# Patient Record
Sex: Female | Born: 1970 | Race: White | Hispanic: No | Marital: Married
Health system: Southern US, Community
[De-identification: ages and names within clinical notes are randomized; demographics above are authoritative.]

---

## 1998-03-16 ENCOUNTER — Other Ambulatory Visit: Admission: RE | Admit: 1998-03-16 | Discharge: 1998-03-16 | Payer: Self-pay | Admitting: Obstetrics and Gynecology

## 1998-05-10 ENCOUNTER — Observation Stay (HOSPITAL_COMMUNITY): Admission: AD | Admit: 1998-05-10 | Discharge: 1998-05-10 | Payer: Self-pay | Admitting: *Deleted

## 1998-05-22 ENCOUNTER — Inpatient Hospital Stay (HOSPITAL_COMMUNITY): Admission: AD | Admit: 1998-05-22 | Discharge: 1998-05-22 | Payer: Self-pay | Admitting: Obstetrics and Gynecology

## 1998-10-21 ENCOUNTER — Inpatient Hospital Stay (HOSPITAL_COMMUNITY): Admission: AD | Admit: 1998-10-21 | Discharge: 1998-10-21 | Payer: Self-pay | Admitting: Obstetrics and Gynecology

## 1998-10-22 ENCOUNTER — Inpatient Hospital Stay (HOSPITAL_COMMUNITY): Admission: AD | Admit: 1998-10-22 | Discharge: 1998-10-22 | Payer: Self-pay | Admitting: Obstetrics and Gynecology

## 1998-12-20 ENCOUNTER — Inpatient Hospital Stay (HOSPITAL_COMMUNITY): Admission: AD | Admit: 1998-12-20 | Discharge: 1998-12-21 | Payer: Self-pay | Admitting: Obstetrics and Gynecology

## 1999-01-30 ENCOUNTER — Other Ambulatory Visit: Admission: RE | Admit: 1999-01-30 | Discharge: 1999-01-30 | Payer: Self-pay | Admitting: Obstetrics and Gynecology

## 2000-02-28 ENCOUNTER — Other Ambulatory Visit: Admission: RE | Admit: 2000-02-28 | Discharge: 2000-02-28 | Payer: Self-pay | Admitting: Obstetrics and Gynecology

## 2001-02-28 ENCOUNTER — Other Ambulatory Visit: Admission: RE | Admit: 2001-02-28 | Discharge: 2001-02-28 | Payer: Self-pay | Admitting: Obstetrics and Gynecology

## 2002-03-02 ENCOUNTER — Other Ambulatory Visit: Admission: RE | Admit: 2002-03-02 | Discharge: 2002-03-02 | Payer: Self-pay | Admitting: Obstetrics and Gynecology

## 2003-03-18 ENCOUNTER — Encounter: Admission: RE | Admit: 2003-03-18 | Discharge: 2003-03-18 | Payer: Self-pay | Admitting: Obstetrics and Gynecology

## 2003-03-24 ENCOUNTER — Other Ambulatory Visit: Admission: RE | Admit: 2003-03-24 | Discharge: 2003-03-24 | Payer: Self-pay | Admitting: Obstetrics and Gynecology

## 2004-07-30 ENCOUNTER — Inpatient Hospital Stay (HOSPITAL_COMMUNITY): Admission: AD | Admit: 2004-07-30 | Discharge: 2004-07-30 | Payer: Self-pay | Admitting: Obstetrics & Gynecology

## 2004-08-27 ENCOUNTER — Inpatient Hospital Stay (HOSPITAL_COMMUNITY): Admission: AD | Admit: 2004-08-27 | Discharge: 2004-08-27 | Payer: Self-pay | Admitting: Obstetrics and Gynecology

## 2004-11-10 ENCOUNTER — Inpatient Hospital Stay (HOSPITAL_COMMUNITY): Admission: AD | Admit: 2004-11-10 | Discharge: 2004-11-10 | Payer: Self-pay | Admitting: Obstetrics and Gynecology

## 2004-11-11 ENCOUNTER — Inpatient Hospital Stay (HOSPITAL_COMMUNITY): Admission: AD | Admit: 2004-11-11 | Discharge: 2004-11-11 | Payer: Self-pay | Admitting: *Deleted

## 2004-12-11 ENCOUNTER — Inpatient Hospital Stay (HOSPITAL_COMMUNITY): Admission: AD | Admit: 2004-12-11 | Discharge: 2004-12-13 | Payer: Self-pay | Admitting: *Deleted

## 2006-11-18 ENCOUNTER — Ambulatory Visit: Payer: Self-pay | Admitting: Internal Medicine

## 2006-12-04 ENCOUNTER — Ambulatory Visit: Payer: Self-pay | Admitting: Internal Medicine

## 2008-01-19 ENCOUNTER — Ambulatory Visit: Payer: Self-pay | Admitting: Internal Medicine

## 2009-08-03 ENCOUNTER — Ambulatory Visit: Payer: Self-pay | Admitting: Sports Medicine

## 2009-08-03 DIAGNOSIS — M2170 Unequal limb length (acquired), unspecified site: Secondary | ICD-10-CM | POA: Insufficient documentation

## 2009-08-03 DIAGNOSIS — R269 Unspecified abnormalities of gait and mobility: Secondary | ICD-10-CM | POA: Insufficient documentation

## 2009-08-03 DIAGNOSIS — M629 Disorder of muscle, unspecified: Secondary | ICD-10-CM | POA: Insufficient documentation

## 2009-10-07 ENCOUNTER — Encounter: Payer: Self-pay | Admitting: Sports Medicine

## 2010-03-22 ENCOUNTER — Ambulatory Visit: Payer: Self-pay | Admitting: Internal Medicine

## 2010-04-27 ENCOUNTER — Ambulatory Visit: Payer: Self-pay | Admitting: Sports Medicine

## 2010-06-01 ENCOUNTER — Ambulatory Visit
Admission: RE | Admit: 2010-06-01 | Discharge: 2010-06-01 | Payer: Self-pay | Source: Home / Self Care | Attending: Sports Medicine | Admitting: Sports Medicine

## 2010-06-06 NOTE — Assessment & Plan Note (Signed)
Summary: NP,KNEE PAIN,RUNNER,MC   Vital Signs:  Patient profile:   40 year old female Height:      67 inches Weight:      120 pounds BMI:     18.86 BP sitting:   122 / 82  Vitals Entered By: Lillia Pauls CMA (August 03, 2009 11:36 AM)  History of Present Illness: This is a pleasant young lady - runner and triathlete. She has had frustrating probs w ITB issues now for about 3 mos has had good eval by Dr Zachery Dauer sent to PT for strength work and work with SIJ and pseudo leg length probs Mobic has helped as has volt gel  however, even w 8 sessions of PT still gets sharp pain wonders if she needs orthotics or other intervention and comes for my opinion  Xrays from [facility] are wnl  PT notes reviewed and are reasonable rehab program with emphasis on core strength and also regaining nl pelvic alignment  Allergies (verified): 1)  ! Augmentin 2)  ! Pcn 3)  ! Ceftin  Physical Exam  General:  Well-developed,well-nourished,in no acute distress; alert,appropriate and cooperative throughout examination Msk:  Bilat knee exam shows no effusion; stable ligaments; negative Mcmurray's and provocative meniscal tests; non painful patellar compression; patellar and quadriceps tendons unremarkable  good quad and VMO strength bilat alignmnet is good  leg length - left is 1 cm shorter on mult meaures and after adjusting pelvis  FABER OK SI joint mobility - tight and worse on RT today   Hip abduction mod weak on left and mildly weak on RT hip rotation mildly weak on left and norm on RT  feet are neutral  Gait show that she has a rapid horizontal shifting o left pateall and less on RT In fact the entire ITB area shows "wobble" with running on RT turns RT foot out by 5 to 10 deg and left by 3 deg    Impression & Recommendations:  Problem # 1:  ABNORMALITY OF GAIT (ICD-781.2)  This is significant with rotation of foot prob worsening ITB sxs  Sports insoles with lat 1 cm lift  tapered to 1/2 cm med lift on left actually corrects most of foot turnout on left  will try this to control abnorm motion and it does improve it if not successful consider custom orthotics  Orders: Sports Insoles (Z6109)  Problem # 2:  ITBS, 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

## 2010-06-06 NOTE — Consult Note (Signed)
Summary: [facility]   Imported By: Marily Memos 08/03/2009 12:29:17  _____________________________________________________________________  External Attachment:    Type:   Image     Comment:   External Document

## 2010-06-08 NOTE — Consult Note (Signed)
Summary: [facility]  [facility]   Imported By: Marily Memos 04/27/2010 15:19:11  _____________________________________________________________________  External Attachment:    Type:   Image     Comment:   External Document

## 2010-06-08 NOTE — Assessment & Plan Note (Signed)
Summary: F/U IT [facility]   Vital Signs:  Patient profile:   40 year old female Pulse rate:   71 / minute BP sitting:   102 / 68  (right arm)  Vitals Entered By: Rochele Pages RN (June 01, 2010 10:26 AM) CC: f/u bilat ITB pain   CC:  f/u bilat ITB pain.  History of Present Illness: 40yo female to office for f/u ITB syndrome. Last visit 58-month ago & fitted with knee sleeves & 1st ray post on insoles. Started having calf pain with insole changes and stopped using these. Knee sleeves have been helpful up until last week.  Steadily increased mileage - longest run of 9-miles, running about 15-20 miles/wk.   Started to have return of ITB pain in both legs about 4.5 miles into run 5 days ago & need to stop.  No improvement with ITB stretches at the time. Has been doing HEP intermittantly.   Using volaten gel as needed, but not overly helpful. 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 Has been taking a recovery drink after training & long runs.    Preventive Screening-Counseling & Management  Alcohol-Tobacco     Smoking Status: never  Allergies: 1)  ! Augmentin 2)  ! Pcn 3)  ! Ceftin  Social History: Smoking Status:  never  Review of Systems       per HPI, otherwise 10-pt ROS negative  Physical Exam  General:  Well-developed,well-nourished,in no acute distress; alert,appropriate and cooperative throughout examination Msk:  HIPS: FROM b/l without pain.  no TTP over greater troch.  TTP along distal ITB b/l.  good SI joint mobility b/l.  Good strength with abduction, adduction, flexion, extension b/l.    GAIT: Lt leg 1cm shorter than right.  slight horizontal movement of knees b/l - although improved from last visit.  No trendelenburg.  No excess pronation or supination.  No limp.  Neurovascularly intact distally    Impression & Recommendations:  Problem # 1:  ILIOTIBIAL BAND SYNDROME,  BILATERAL (ICD-728.89)  - Suspect aggrevation of symptoms related to increased stress & poor recovery recently. - given new set of body helix knee sleeves to wear with running - Reviewed proper nutrition & hydration - recommended use of foam roller over ITB - handout given - Cont ITB stretches - f/u 4 weeks if symptoms persist - Plan to check body fat when training regularly  Orders: Garment,belt,sleeve or other covering ,elastic or similar stretch (Z6109)  Problem # 2:  UNEQUAL LEG LENGTH (ICD-736.81) - Lt leg 1cm shorter than left - no significant trendelenburg.  May be contributor to ITBS  Patient Instructions: 1)  We think your current IT band symptoms likely related to decreased rest/recovery over the last week.   2)  Start using foam roller on IT band to help loosen. 3)  Cont. to wear knee sleeves as you are doing. 4)  For hydration drink may try starting with regular gatorade, add salt (as stated below) if still having cramps & heavy salt loss.  Add 1/2 tsp salt to 16-20 oz of gatorade to help with salt loss.   5)  May consider adding more carbohydrate or small amount of caffeine if exercising over 2-hours. 6)  Ensure having good recovery drink within 1-hr after race - cont. recovery drink you are using. 7)  May consider checking body fat level in future before starting serious racing. 8)  Follow-up as needed for  IT band syndrome.   9)  Follow-up for body fat measurement when well into your training program.   Orders Added: 1)  Est. Patient Level III [95284] 2)  Garment,belt,sleeve or other covering ,elastic or similar stretch [X3244]

## 2010-06-08 NOTE — Assessment & Plan Note (Signed)
Summary: IT BAND PAIN,MC   Vital Signs:  Patient profile:   40 year old female Height:      67 inches Weight:      118 pounds BP sitting:   104 / 74  Vitals Entered By: Lillia Pauls CMA (April 27, 2010 2:43 PM)  History of Present Illness: Has done multiple competitive triathalons this past fall has also done half marathons this fall.  Now getting burning at both lateral ITB.  Has not noticed significant swelling Very painful after 5-6 miles. Has been to PT for this problem.    I had seen her in march did not tolerate green sports insoles with lift for shorter left leg  comes for repeat evalution as she cannot increase training 2/2 her lat knee pain    Allergies: 1)  ! Augmentin 2)  ! Pcn 3)  ! Ceftin  Physical Exam  General:  Well-developed,well-nourished,in no acute distress; alert,appropriate and cooperative throughout examination Msk:  Left leg 1 cm shorter than rt. SI joint motion good bilat Strong abductors bilat Hip rotation strength excellent bilat Adductors strong bilat   Extremities:  Gait seems to be key issue with bilat rapid horizontal knee and quad motion there is no trendelenburg she does not show excess pronation or supination and has neutral strike but forefoot varus   Impression & Recommendations:  Problem # 1:  ILIOTIBIAL BAND SYNDROME, BILATERAL (ICD-728.89) goos strength good stretching  seems this must be biomechanical  try body helix  open patella  Problem # 2:  ABNORMALITY OF GAIT (ICD-781.2)  Orders: Garment,belt,sleeve or other covering ,elastic or similar stretch (Z3664)  note gait imporved w first ray post this takes out some of horizontal wobble trail for 1 mo may need cusstom orthotic  Problem # 3:  UNEQUAL LEG LENGTH (ICD-736.81)  Orders: Garment,belt,sleeve or other covering ,elastic or similar stretch (Q0347)  correction removed as this did not seem to be helping and she does not trendelenburg  reck 1  mo   Orders Added: 1)  Garment,belt,sleeve or other covering ,elastic or similar stretch [A4466] 2)  Est. Patient Level III [42595]

## 2010-09-22 NOTE — H&P (Signed)
NAMEMEDINA, DEGRAFFENREID           ACCOUNT NO.:  [id number]   MEDICAL RECORD NO.:  [id number]          PATIENT TYPE:  INP   LOCATION:  9165                          FACILITY:  [facility]   PHYSICIAN:  Lenoard Aden, M.D.DATE OF BIRTH:  Feb 22, 1971   DATE OF ADMISSION:  12/11/2004  DATE OF DISCHARGE:                                HISTORY & PHYSICAL   CHIEF COMPLAINT:  Labor.   HISTORY OF PRESENT ILLNESS:  The patient is a 41 year old white female G3,  P2, EDD of December 28, 2004, at 37+ weeks with increased frequency of  contractions, history of advanced cervical dilatation.   PAST MEDICAL HISTORY:  Remarkable for spontaneous vaginal delivery x2.   ALLERGIES:  PENICILLIN, CEFTIN AND AUGMENTIN.   OBSTETRIC HISTORY:  She has a history of spontaneous vaginal delivery at 38  weeks on two occasions. Pregnancy course otherwise uncomplicated.   PAST MEDICAL HISTORY:  1.  History of sinus surgery.  2.  History of diabetes.  3.  Hypertension.   PRENATAL LAB DATA:  Blood type A positive, rubella immune, hepatitis  negative, GBS negative.   PHYSICAL EXAMINATION:  GENERAL:  She is a well-developed, well-nourished  white female in no acute distress.  HEENT:  Normal.  LUNGS:  Clear.  HEART:  Regular rhythm.  ABDOMEN:  Soft, gravid, nontender.  NEUROLOGIC EXAM:  Nonfocal.  PELVIC EXAM:  Cervix is 5 cm, vertex at -1.  EXTREMITIES:  Normal.   IMPRESSION:  Term intrauterine pregnancy in active labor.  History of GBS positivity, negative GBS with pregnancy.   PLAN:  Anticipate attempts at vaginal delivery. Risks, benefits discussed.  The patient acknowledges and will proceed.       RJT/MEDQ  D:  12/12/2004  T:  12/12/2004  Job:  [id number]

## 2011-06-19 ENCOUNTER — Ambulatory Visit: Payer: Self-pay | Admitting: Internal Medicine

## 2011-10-24 ENCOUNTER — Ambulatory Visit (INDEPENDENT_AMBULATORY_CARE_PROVIDER_SITE_OTHER): Payer: BC Managed Care – PPO | Admitting: Sports Medicine

## 2011-10-24 VITALS — BP 100/70

## 2011-10-24 DIAGNOSIS — M79671 Pain in right foot: Secondary | ICD-10-CM | POA: Insufficient documentation

## 2011-10-24 DIAGNOSIS — M79609 Pain in unspecified limb: Secondary | ICD-10-CM

## 2011-10-24 NOTE — Patient Instructions (Addendum)
Use ankle brace as discussed  Do 3 sets of 15 of the below exercises to help with ankle stability: Stair step exercise  Cone touches One leg balances  Return as needed

## 2011-10-24 NOTE — Assessment & Plan Note (Addendum)
Pain most likely due to hypermobility of ankle joint and cuboid subluxation.  Body helix- X- sleeve given to patient for stablility of joint during activity.  Pt to return as needed.  Pt also given exercises to improve ankle stablility- see patient instructions.  Note that her running gait seemed pretty neutral with slt foot turnout bilat but no excess motion

## 2011-10-24 NOTE — Progress Notes (Signed)
  Subjective:    Patient ID: Rachel Gomez, female    DOB: 03-Jan-1971, 41 y.o.   MRN: [id number]  HPI Right foot pain: Patient reports pain in right Achilles area this started in late May after running a half iron man. This improved with rest over a couple of weeks. And currently has resolved. But is getting ready to train again for another race and wanted to have this evaluated before starting training regimen again. Also having right dorsal foot pain that started this weekend. Describes it as a sharp pain. This started in the morning upon awakening over the weekend. Worse supportive shoes and this seemed to help. Pain in back of right foot now greatly improved. Has not had any pain today. No redness. No swelling.    Review of Systems As per above.    Objective:   Physical Exam  Constitutional: She appears well-developed.  Cardiovascular: Normal rate.   Pulmonary/Chest: Effort normal.  Musculoskeletal: She exhibits no edema.       Right ankle/ foot: Normal appearance. No redness. No swelling.  Normal rom.-no pain. Ligaments wnl. No pain on palpation of ankle or foot.  Normal strength.  + hypermobility of ankle joint/ligaments.    AT is not thickened or TTP On cuboid motion test she gets excess motion at RT cuboid articulation with base 5th MT        Assessment & Plan:

## 2012-02-14 ENCOUNTER — Ambulatory Visit (INDEPENDENT_AMBULATORY_CARE_PROVIDER_SITE_OTHER): Payer: BC Managed Care – PPO | Admitting: Sports Medicine

## 2012-02-14 VITALS — BP 98/62 | Ht 67.0 in | Wt 120.0 lb

## 2012-02-14 DIAGNOSIS — M25579 Pain in unspecified ankle and joints of unspecified foot: Secondary | ICD-10-CM

## 2012-02-14 DIAGNOSIS — M7630 Iliotibial band syndrome, unspecified leg: Secondary | ICD-10-CM

## 2012-02-14 DIAGNOSIS — M629 Disorder of muscle, unspecified: Secondary | ICD-10-CM

## 2012-02-14 DIAGNOSIS — M25569 Pain in unspecified knee: Secondary | ICD-10-CM

## 2012-02-14 MED ORDER — DICLOFENAC SODIUM 1 % TD GEL: TRANSDERMAL | Status: AC

## 2012-02-14 NOTE — Progress Notes (Signed)
  Subjective:    Patient ID: Rachel Gomez, female    DOB: May 20, 1970, 41 y.o.   MRN: [id number]  HPI chief complaint left knee and left ankle pain  Very pleasant 41 year old runner comes in today complaining of both left ankle and left knee pain. In regards to the left ankle she's having some feelings of instability and some pain at the insertion of the Achilles tendon onto the calcaneus. She's had similar problems in her right ankle which is responded well to a body helix X. brace she would like to try one for her left. In regards to the left knee, she had a sudden onset of lateral left knee pain at about mile 14 of a 20 mile run this past weekend. She was able to complete her run but has had persistent discomfort since. She has a history of IT band in each of her knees, but feels like her current pain is different in nature to what she has experienced previously. She describes a dull aching pain that is worse with weightbearing. She has not noticed any swelling. She denies any locking catching or feelings of instability. She was given a body helix knee sleeve on her previous visit which she wears on occasion. She's currently training for the [facility] marathon which is to take place in 3 weeks. She denies any pain in her hip. She saw her chiropractor earlier this week for an adjustment and he mentioned to her that she may have a early shin splints but she denies any pain in the lower leg.     Review of Systems     Objective:   Physical Exam Well-developed fit appearing 41 year old female. No acute distress. Awake alert and oriented x3. Vital signs are reviewed  Left knee: Full range of motion without an effusion. No soft tissue swelling. She is tender to palpation over the lateral femoral condyle at the distal IT band. No joint line tenderness. Negative McMurray. Negative Thessalys. Good ligamentous stability. Good quad strength, but she does have weakness with resisted hip abduction. Left  ankle: Full range of motion no effusion. Slight tenderness at the calcaneal insertion of the Achilles tendon but no thickening of the tendon is appreciated. No nodularities. No thickening. Neurovascularly intact distally. Walks without a limp.       Assessment & Plan:  1. Left knee pain secondary to distal IT band syndrome 2. 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 strap for the left ankle. Although her pain is a little different in nature than what she experienced previously with her IT band problems, her exam and history do fit with this diagnosis. I've given her a refill on her Voltaren gel which has been helpful to her in the past. I recommended that she resume using her body helix knee sleeve. Home exercise program consisting of IT band stretches and hip abductor strengthening. She will ice her knee at the end of activity. She can continue to train for her marathon as her symptoms allow, but they may not completely resolve until after the race. She will let me know if symptoms worsen or she develops swelling in the knee. Followup prn.

## 2012-03-11 ENCOUNTER — Ambulatory Visit: Payer: BC Managed Care – PPO | Admitting: Sports Medicine

## 2012-06-20 ENCOUNTER — Ambulatory Visit: Payer: Self-pay | Admitting: Internal Medicine

## 2013-08-10 ENCOUNTER — Ambulatory Visit: Payer: Self-pay | Admitting: Obstetrics and Gynecology

## 2013-08-17 ENCOUNTER — Ambulatory Visit: Payer: Self-pay | Admitting: Internal Medicine

## 2014-08-12 ENCOUNTER — Ambulatory Visit
Admit: 2014-08-12 | Disposition: A | Discharge status: Home / Self Care | Payer: Self-pay | Attending: Internal Medicine | Admitting: Internal Medicine

## 2014-08-18 ENCOUNTER — Ambulatory Visit
Admit: 2014-08-18 | Disposition: A | Discharge status: Home / Self Care | Payer: Self-pay | Attending: Obstetrics and Gynecology | Admitting: Obstetrics and Gynecology

## 2015-01-03 ENCOUNTER — Other Ambulatory Visit: Payer: Self-pay | Admitting: General Surgery

## 2015-01-03 DIAGNOSIS — R928 Other abnormal and inconclusive findings on diagnostic imaging of breast: Secondary | ICD-10-CM

## 2015-02-08 ENCOUNTER — Ambulatory Visit
Admission: RE | Admit: 2015-02-08 | Discharge: 2015-02-08 | Disposition: A | Discharge status: Home / Self Care | Payer: BLUE CROSS/BLUE SHIELD | Source: Ambulatory Visit | Attending: General Surgery | Admitting: General Surgery

## 2015-02-08 DIAGNOSIS — R928 Other abnormal and inconclusive findings on diagnostic imaging of breast: Secondary | ICD-10-CM

## 2015-03-12 ENCOUNTER — Other Ambulatory Visit: Payer: Self-pay | Admitting: General Surgery

## 2015-03-12 DIAGNOSIS — N6001 Solitary cyst of right breast: Secondary | ICD-10-CM

## 2015-08-15 ENCOUNTER — Other Ambulatory Visit: Payer: BLUE CROSS/BLUE SHIELD

## 2015-08-17 ENCOUNTER — Ambulatory Visit
Admission: RE | Admit: 2015-08-17 | Discharge: 2015-08-17 | Disposition: A | Discharge status: Home / Self Care | Payer: BLUE CROSS/BLUE SHIELD | Source: Ambulatory Visit | Attending: General Surgery | Admitting: General Surgery

## 2015-08-17 DIAGNOSIS — N6001 Solitary cyst of right breast: Secondary | ICD-10-CM

## 2016-07-09 ENCOUNTER — Other Ambulatory Visit: Payer: Self-pay | Admitting: Internal Medicine

## 2016-07-09 DIAGNOSIS — N6001 Solitary cyst of right breast: Secondary | ICD-10-CM

## 2016-07-18 DIAGNOSIS — L600 Ingrowing nail: Secondary | ICD-10-CM | POA: Diagnosis not present

## 2016-07-18 DIAGNOSIS — M79674 Pain in right toe(s): Secondary | ICD-10-CM | POA: Diagnosis not present

## 2016-07-18 DIAGNOSIS — L602 Onychogryphosis: Secondary | ICD-10-CM | POA: Diagnosis not present

## 2016-07-18 DIAGNOSIS — M79675 Pain in left toe(s): Secondary | ICD-10-CM | POA: Diagnosis not present

## 2016-08-01 DIAGNOSIS — L600 Ingrowing nail: Secondary | ICD-10-CM | POA: Diagnosis not present

## 2016-08-01 DIAGNOSIS — M79675 Pain in left toe(s): Secondary | ICD-10-CM | POA: Diagnosis not present

## 2016-08-01 DIAGNOSIS — L602 Onychogryphosis: Secondary | ICD-10-CM | POA: Diagnosis not present

## 2016-08-01 DIAGNOSIS — M79674 Pain in right toe(s): Secondary | ICD-10-CM | POA: Diagnosis not present

## 2016-08-16 ENCOUNTER — Other Ambulatory Visit: Payer: Self-pay | Admitting: Obstetrics and Gynecology

## 2016-08-16 DIAGNOSIS — N6001 Solitary cyst of right breast: Secondary | ICD-10-CM

## 2016-08-17 ENCOUNTER — Inpatient Hospital Stay: Admission: RE | Admit: 2016-08-17 | Payer: BLUE CROSS/BLUE SHIELD | Source: Ambulatory Visit

## 2016-08-17 ENCOUNTER — Other Ambulatory Visit: Payer: BLUE CROSS/BLUE SHIELD

## 2016-08-23 ENCOUNTER — Ambulatory Visit
Admission: RE | Admit: 2016-08-23 | Discharge: 2016-08-23 | Disposition: A | Discharge status: Home / Self Care | Payer: BLUE CROSS/BLUE SHIELD | Source: Ambulatory Visit | Attending: Obstetrics and Gynecology | Admitting: Obstetrics and Gynecology

## 2016-08-23 DIAGNOSIS — N6001 Solitary cyst of right breast: Secondary | ICD-10-CM

## 2016-11-09 DIAGNOSIS — J329 Chronic sinusitis, unspecified: Secondary | ICD-10-CM | POA: Diagnosis not present

## 2016-11-12 DIAGNOSIS — L600 Ingrowing nail: Secondary | ICD-10-CM | POA: Diagnosis not present

## 2016-11-12 DIAGNOSIS — L602 Onychogryphosis: Secondary | ICD-10-CM | POA: Diagnosis not present

## 2016-12-12 DIAGNOSIS — Z0000 Encounter for general adult medical examination without abnormal findings: Secondary | ICD-10-CM | POA: Diagnosis not present

## 2016-12-17 DIAGNOSIS — H524 Presbyopia: Secondary | ICD-10-CM | POA: Diagnosis not present

## 2016-12-17 DIAGNOSIS — Z0000 Encounter for general adult medical examination without abnormal findings: Secondary | ICD-10-CM | POA: Diagnosis not present

## 2017-03-04 DIAGNOSIS — Z23 Encounter for immunization: Secondary | ICD-10-CM | POA: Diagnosis not present

## 2017-03-18 DIAGNOSIS — L602 Onychogryphosis: Secondary | ICD-10-CM | POA: Diagnosis not present

## 2017-03-18 DIAGNOSIS — L600 Ingrowing nail: Secondary | ICD-10-CM | POA: Diagnosis not present

## 2017-04-10 DIAGNOSIS — R102 Pelvic and perineal pain: Secondary | ICD-10-CM | POA: Diagnosis not present

## 2017-04-20 DIAGNOSIS — J019 Acute sinusitis, unspecified: Secondary | ICD-10-CM | POA: Diagnosis not present

## 2017-05-06 DIAGNOSIS — Z01419 Encounter for gynecological examination (general) (routine) without abnormal findings: Secondary | ICD-10-CM | POA: Diagnosis not present

## 2017-05-06 DIAGNOSIS — Z681 Body mass index (BMI) 19 or less, adult: Secondary | ICD-10-CM | POA: Diagnosis not present

## 2017-05-14 DIAGNOSIS — J029 Acute pharyngitis, unspecified: Secondary | ICD-10-CM | POA: Diagnosis not present

## 2017-05-14 DIAGNOSIS — R6889 Other general symptoms and signs: Secondary | ICD-10-CM | POA: Diagnosis not present

## 2017-05-24 DIAGNOSIS — J019 Acute sinusitis, unspecified: Secondary | ICD-10-CM | POA: Diagnosis not present

## 2017-06-17 DIAGNOSIS — I8392 Asymptomatic varicose veins of left lower extremity: Secondary | ICD-10-CM | POA: Diagnosis not present

## 2017-06-18 DIAGNOSIS — J019 Acute sinusitis, unspecified: Secondary | ICD-10-CM | POA: Diagnosis not present

## 2017-06-18 DIAGNOSIS — I872 Venous insufficiency (chronic) (peripheral): Secondary | ICD-10-CM | POA: Diagnosis not present

## 2017-06-18 DIAGNOSIS — I8392 Asymptomatic varicose veins of left lower extremity: Secondary | ICD-10-CM | POA: Diagnosis not present

## 2017-07-15 DIAGNOSIS — I781 Nevus, non-neoplastic: Secondary | ICD-10-CM | POA: Diagnosis not present

## 2017-07-24 ENCOUNTER — Other Ambulatory Visit: Payer: Self-pay | Admitting: Obstetrics and Gynecology

## 2017-07-24 DIAGNOSIS — Z1231 Encounter for screening mammogram for malignant neoplasm of breast: Secondary | ICD-10-CM

## 2017-08-09 DIAGNOSIS — M5386 Other specified dorsopathies, lumbar region: Secondary | ICD-10-CM | POA: Diagnosis not present

## 2017-08-09 DIAGNOSIS — M9903 Segmental and somatic dysfunction of lumbar region: Secondary | ICD-10-CM | POA: Diagnosis not present

## 2017-08-09 DIAGNOSIS — M62838 Other muscle spasm: Secondary | ICD-10-CM | POA: Diagnosis not present

## 2017-08-12 DIAGNOSIS — M62838 Other muscle spasm: Secondary | ICD-10-CM | POA: Diagnosis not present

## 2017-08-12 DIAGNOSIS — M9903 Segmental and somatic dysfunction of lumbar region: Secondary | ICD-10-CM | POA: Diagnosis not present

## 2017-08-12 DIAGNOSIS — M5386 Other specified dorsopathies, lumbar region: Secondary | ICD-10-CM | POA: Diagnosis not present

## 2017-08-14 DIAGNOSIS — M5387 Other specified dorsopathies, lumbosacral region: Secondary | ICD-10-CM | POA: Diagnosis not present

## 2017-08-14 DIAGNOSIS — M9903 Segmental and somatic dysfunction of lumbar region: Secondary | ICD-10-CM | POA: Diagnosis not present

## 2017-08-14 DIAGNOSIS — M5386 Other specified dorsopathies, lumbar region: Secondary | ICD-10-CM | POA: Diagnosis not present

## 2017-08-14 DIAGNOSIS — M62838 Other muscle spasm: Secondary | ICD-10-CM | POA: Diagnosis not present

## 2017-08-26 DIAGNOSIS — M5387 Other specified dorsopathies, lumbosacral region: Secondary | ICD-10-CM | POA: Diagnosis not present

## 2017-08-26 DIAGNOSIS — M9904 Segmental and somatic dysfunction of sacral region: Secondary | ICD-10-CM | POA: Diagnosis not present

## 2017-08-26 DIAGNOSIS — M9903 Segmental and somatic dysfunction of lumbar region: Secondary | ICD-10-CM | POA: Diagnosis not present

## 2017-08-27 ENCOUNTER — Ambulatory Visit
Admission: RE | Admit: 2017-08-27 | Discharge: 2017-08-27 | Disposition: A | Discharge status: Home / Self Care | Payer: BLUE CROSS/BLUE SHIELD | Source: Ambulatory Visit | Attending: Obstetrics and Gynecology | Admitting: Obstetrics and Gynecology

## 2017-08-27 DIAGNOSIS — Z1231 Encounter for screening mammogram for malignant neoplasm of breast: Secondary | ICD-10-CM

## 2017-08-27 DIAGNOSIS — Z23 Encounter for immunization: Secondary | ICD-10-CM | POA: Diagnosis not present

## 2017-09-05 DIAGNOSIS — N760 Acute vaginitis: Secondary | ICD-10-CM | POA: Diagnosis not present

## 2017-09-05 DIAGNOSIS — B373 Candidiasis of vulva and vagina: Secondary | ICD-10-CM | POA: Diagnosis not present

## 2017-10-11 DIAGNOSIS — N94819 Vulvodynia, unspecified: Secondary | ICD-10-CM | POA: Diagnosis not present

## 2017-10-22 DIAGNOSIS — Z23 Encounter for immunization: Secondary | ICD-10-CM | POA: Diagnosis not present

## 2017-11-04 DIAGNOSIS — R102 Pelvic and perineal pain: Secondary | ICD-10-CM | POA: Diagnosis not present

## 2017-12-11 DIAGNOSIS — H524 Presbyopia: Secondary | ICD-10-CM | POA: Diagnosis not present

## 2017-12-16 DIAGNOSIS — H524 Presbyopia: Secondary | ICD-10-CM | POA: Diagnosis not present

## 2018-01-16 DIAGNOSIS — Z79899 Other long term (current) drug therapy: Secondary | ICD-10-CM | POA: Diagnosis not present

## 2018-01-16 DIAGNOSIS — J0191 Acute recurrent sinusitis, unspecified: Secondary | ICD-10-CM | POA: Diagnosis not present

## 2018-03-05 DIAGNOSIS — Z23 Encounter for immunization: Secondary | ICD-10-CM | POA: Diagnosis not present

## 2018-04-11 DIAGNOSIS — R232 Flushing: Secondary | ICD-10-CM | POA: Diagnosis not present

## 2018-04-11 DIAGNOSIS — N760 Acute vaginitis: Secondary | ICD-10-CM | POA: Diagnosis not present

## 2018-04-11 DIAGNOSIS — N941 Unspecified dyspareunia: Secondary | ICD-10-CM | POA: Diagnosis not present

## 2018-05-08 DIAGNOSIS — Z6820 Body mass index (BMI) 20.0-20.9, adult: Secondary | ICD-10-CM | POA: Diagnosis not present

## 2018-05-08 DIAGNOSIS — Z01419 Encounter for gynecological examination (general) (routine) without abnormal findings: Secondary | ICD-10-CM | POA: Diagnosis not present

## 2018-05-28 DIAGNOSIS — J0191 Acute recurrent sinusitis, unspecified: Secondary | ICD-10-CM | POA: Diagnosis not present

## 2018-06-04 DIAGNOSIS — R002 Palpitations: Secondary | ICD-10-CM | POA: Diagnosis not present

## 2018-08-20 ENCOUNTER — Other Ambulatory Visit: Payer: Self-pay | Admitting: Obstetrics and Gynecology

## 2018-08-20 DIAGNOSIS — Z1231 Encounter for screening mammogram for malignant neoplasm of breast: Secondary | ICD-10-CM

## 2018-10-09 DIAGNOSIS — N949 Unspecified condition associated with female genital organs and menstrual cycle: Secondary | ICD-10-CM | POA: Diagnosis not present

## 2018-10-20 ENCOUNTER — Ambulatory Visit
Admission: RE | Admit: 2018-10-20 | Discharge: 2018-10-20 | Disposition: A | Discharge status: Home / Self Care | Payer: BLUE CROSS/BLUE SHIELD | Source: Ambulatory Visit | Attending: Obstetrics and Gynecology | Admitting: Obstetrics and Gynecology

## 2018-10-20 ENCOUNTER — Other Ambulatory Visit: Payer: Self-pay

## 2018-10-20 DIAGNOSIS — Z1231 Encounter for screening mammogram for malignant neoplasm of breast: Secondary | ICD-10-CM

## 2018-11-24 ENCOUNTER — Other Ambulatory Visit: Payer: Self-pay

## 2018-11-24 DIAGNOSIS — Z20822 Contact with and (suspected) exposure to covid-19: Secondary | ICD-10-CM

## 2018-11-24 DIAGNOSIS — R6889 Other general symptoms and signs: Secondary | ICD-10-CM | POA: Diagnosis not present

## 2018-11-28 LAB — NOVEL CORONAVIRUS, NAA: SARS-CoV-2, NAA: NOT DETECTED

## 2018-12-04 DIAGNOSIS — H524 Presbyopia: Secondary | ICD-10-CM | POA: Diagnosis not present

## 2019-03-10 DIAGNOSIS — M9902 Segmental and somatic dysfunction of thoracic region: Secondary | ICD-10-CM | POA: Diagnosis not present

## 2019-03-10 DIAGNOSIS — M25551 Pain in right hip: Secondary | ICD-10-CM | POA: Diagnosis not present

## 2019-03-10 DIAGNOSIS — M9901 Segmental and somatic dysfunction of cervical region: Secondary | ICD-10-CM | POA: Diagnosis not present

## 2019-03-10 DIAGNOSIS — M436 Torticollis: Secondary | ICD-10-CM | POA: Diagnosis not present

## 2019-03-11 DIAGNOSIS — M25551 Pain in right hip: Secondary | ICD-10-CM | POA: Diagnosis not present

## 2019-03-11 DIAGNOSIS — M9901 Segmental and somatic dysfunction of cervical region: Secondary | ICD-10-CM | POA: Diagnosis not present

## 2019-03-11 DIAGNOSIS — M436 Torticollis: Secondary | ICD-10-CM | POA: Diagnosis not present

## 2019-03-11 DIAGNOSIS — M9902 Segmental and somatic dysfunction of thoracic region: Secondary | ICD-10-CM | POA: Diagnosis not present

## 2019-03-18 DIAGNOSIS — Z13220 Encounter for screening for lipoid disorders: Secondary | ICD-10-CM | POA: Diagnosis not present

## 2019-03-18 DIAGNOSIS — Z1389 Encounter for screening for other disorder: Secondary | ICD-10-CM | POA: Diagnosis not present

## 2019-03-18 DIAGNOSIS — B351 Tinea unguium: Secondary | ICD-10-CM | POA: Diagnosis not present

## 2019-03-18 DIAGNOSIS — L603 Nail dystrophy: Secondary | ICD-10-CM | POA: Diagnosis not present

## 2019-03-18 DIAGNOSIS — L608 Other nail disorders: Secondary | ICD-10-CM | POA: Diagnosis not present

## 2019-03-18 DIAGNOSIS — Z0000 Encounter for general adult medical examination without abnormal findings: Secondary | ICD-10-CM | POA: Diagnosis not present

## 2019-03-25 DIAGNOSIS — Z0000 Encounter for general adult medical examination without abnormal findings: Secondary | ICD-10-CM | POA: Diagnosis not present

## 2019-03-25 DIAGNOSIS — E538 Deficiency of other specified B group vitamins: Secondary | ICD-10-CM | POA: Diagnosis not present

## 2019-04-06 ENCOUNTER — Other Ambulatory Visit: Payer: Self-pay

## 2019-04-06 DIAGNOSIS — Z20822 Contact with and (suspected) exposure to covid-19: Secondary | ICD-10-CM

## 2019-04-07 ENCOUNTER — Telehealth: Payer: Self-pay | Admitting: *Deleted

## 2019-04-07 NOTE — Telephone Encounter (Signed)
Patient called for covid results ,still pending advised to call back. 

## 2019-04-08 LAB — NOVEL CORONAVIRUS, NAA: SARS-CoV-2, NAA: NOT DETECTED

## 2019-04-15 ENCOUNTER — Other Ambulatory Visit: Payer: Self-pay

## 2019-04-15 DIAGNOSIS — Z20822 Contact with and (suspected) exposure to covid-19: Secondary | ICD-10-CM

## 2019-04-17 LAB — NOVEL CORONAVIRUS, NAA: SARS-CoV-2, NAA: NOT DETECTED

## 2019-12-07 ENCOUNTER — Other Ambulatory Visit: Payer: Self-pay | Admitting: Obstetrics & Gynecology

## 2019-12-07 DIAGNOSIS — Z1231 Encounter for screening mammogram for malignant neoplasm of breast: Secondary | ICD-10-CM

## 2019-12-08 ENCOUNTER — Ambulatory Visit
Admission: RE | Admit: 2019-12-08 | Discharge: 2019-12-08 | Disposition: A | Discharge status: Home / Self Care | Payer: 59 | Source: Ambulatory Visit | Attending: Obstetrics & Gynecology | Admitting: Obstetrics & Gynecology

## 2019-12-08 ENCOUNTER — Other Ambulatory Visit: Payer: Self-pay

## 2019-12-08 DIAGNOSIS — Z1231 Encounter for screening mammogram for malignant neoplasm of breast: Secondary | ICD-10-CM

## 2020-04-20 ENCOUNTER — Other Ambulatory Visit: Payer: 59

## 2020-04-20 DIAGNOSIS — Z20822 Contact with and (suspected) exposure to covid-19: Secondary | ICD-10-CM

## 2020-04-22 LAB — NOVEL CORONAVIRUS, NAA: SARS-CoV-2, NAA: NOT DETECTED

## 2020-04-22 LAB — SARS-COV-2, NAA 2 DAY TAT

## 2020-05-18 ENCOUNTER — Other Ambulatory Visit: Payer: 59

## 2020-05-18 DIAGNOSIS — Z20822 Contact with and (suspected) exposure to covid-19: Secondary | ICD-10-CM

## 2020-05-20 LAB — NOVEL CORONAVIRUS, NAA: SARS-CoV-2, NAA: NOT DETECTED

## 2020-05-20 LAB — SARS-COV-2, NAA 2 DAY TAT

## 2020-05-21 ENCOUNTER — Other Ambulatory Visit: Payer: 59

## 2020-05-21 DIAGNOSIS — Z20822 Contact with and (suspected) exposure to covid-19: Secondary | ICD-10-CM

## 2020-05-24 LAB — NOVEL CORONAVIRUS, NAA: SARS-CoV-2, NAA: NOT DETECTED

## 2020-10-21 ENCOUNTER — Other Ambulatory Visit: Payer: Self-pay | Admitting: Internal Medicine

## 2020-10-21 DIAGNOSIS — Z1231 Encounter for screening mammogram for malignant neoplasm of breast: Secondary | ICD-10-CM

## 2020-12-12 ENCOUNTER — Other Ambulatory Visit: Payer: Self-pay

## 2020-12-12 ENCOUNTER — Ambulatory Visit
Admission: RE | Admit: 2020-12-12 | Discharge: 2020-12-12 | Disposition: A | Discharge status: Home / Self Care | Payer: 59 | Source: Ambulatory Visit | Attending: Internal Medicine | Admitting: Internal Medicine

## 2020-12-12 DIAGNOSIS — Z1231 Encounter for screening mammogram for malignant neoplasm of breast: Secondary | ICD-10-CM

## 2020-12-12 IMAGING — MG DIGITAL SCREENING BILATERAL MAMMOGRAM WITH TOMO AND CAD
8 series · 9 of 24 positions shown · non-contrast
Comparison: Previous exam(s).

CLINICAL DATA: Screening.

EXAM:
DIGITAL SCREENING BILATERAL MAMMOGRAM WITH TOMO AND CAD

[R MLO synth-2D]
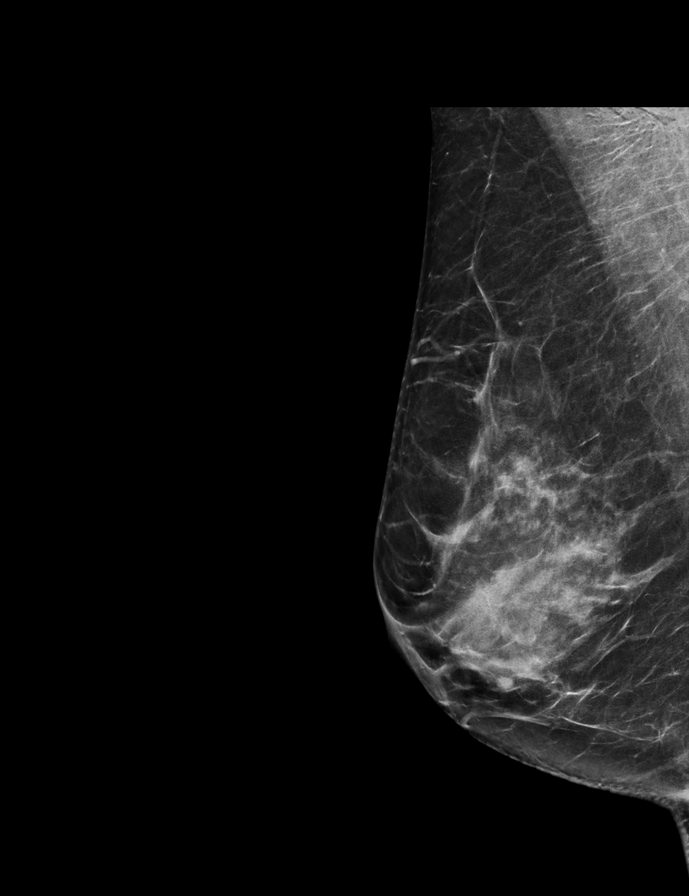

[R CC synth-2D]
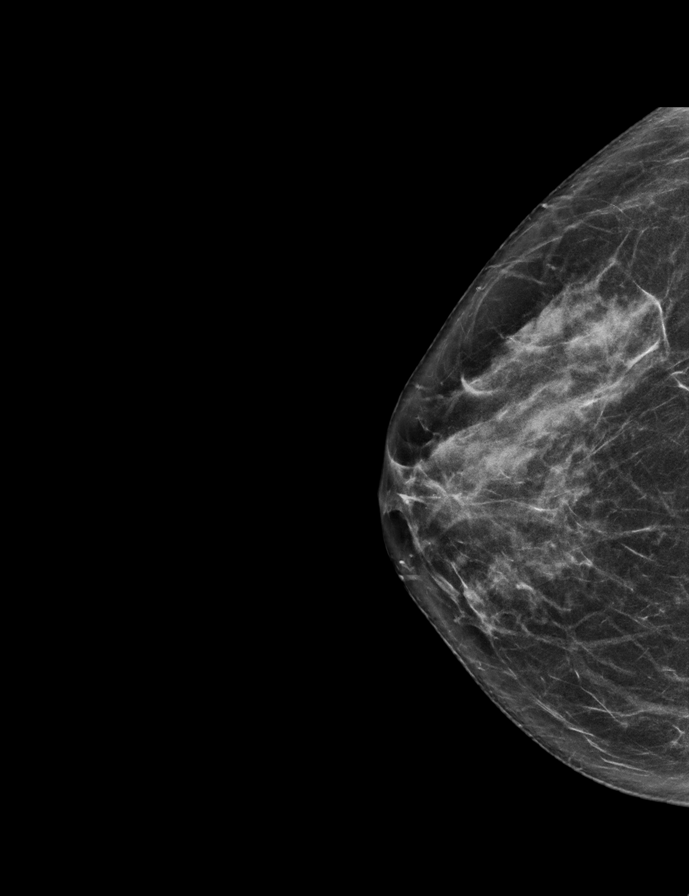

[L CC synth-2D]
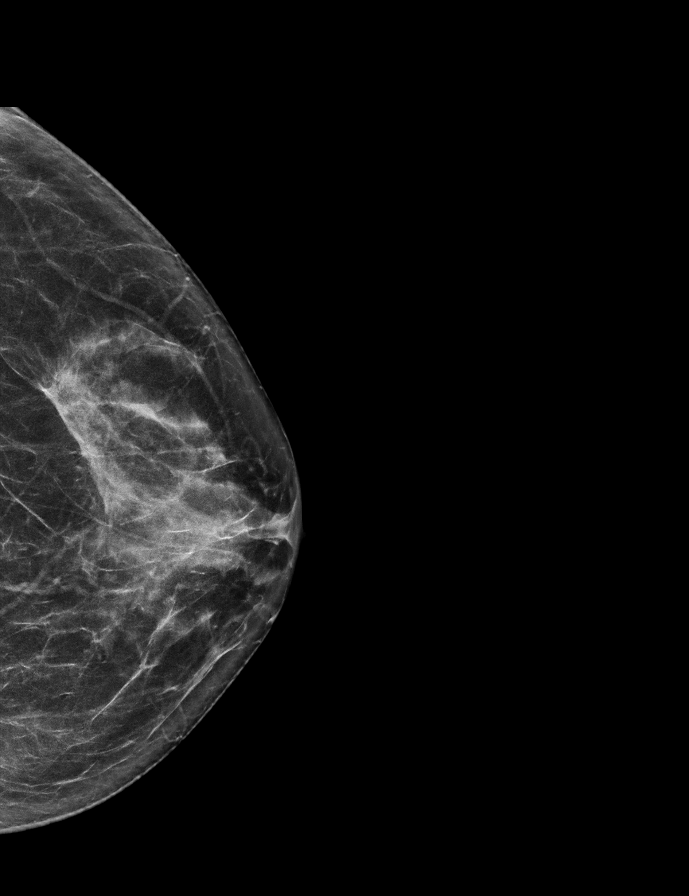

[L MLO synth-2D]
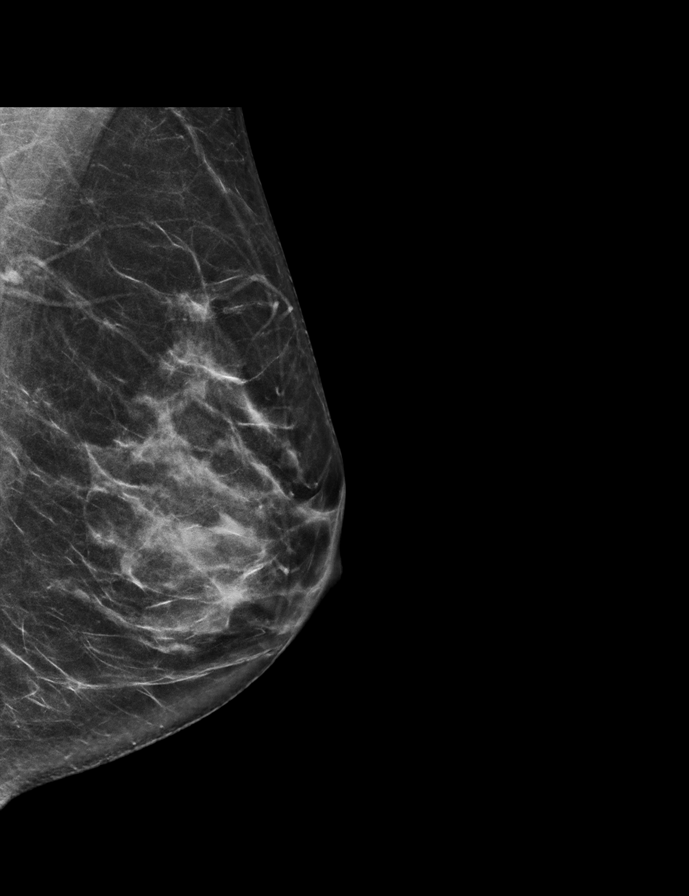

[R CC tomo · 2 of 61 frames shown]
[frame 20/61]
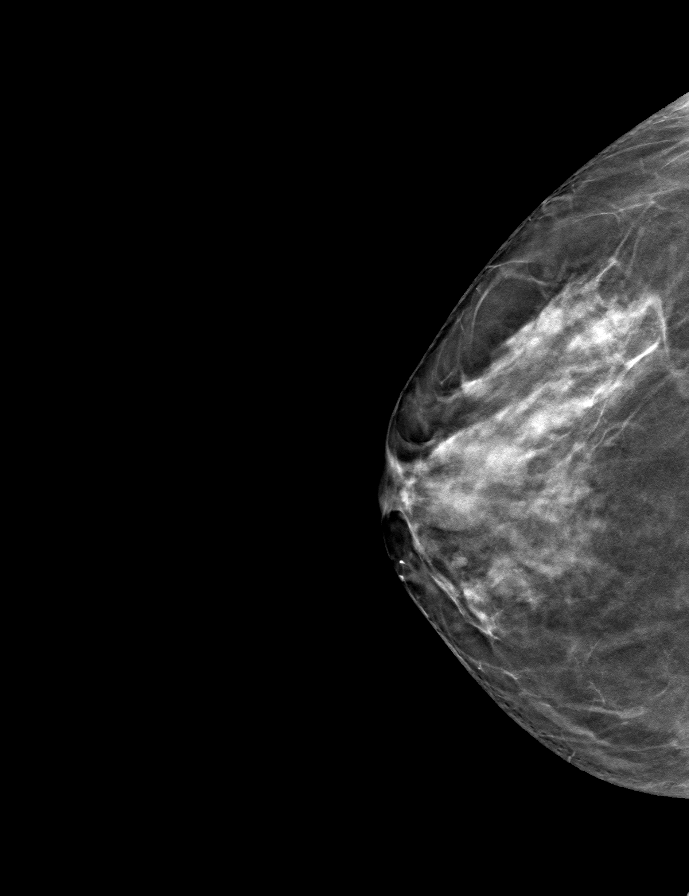
[frame 31/61]
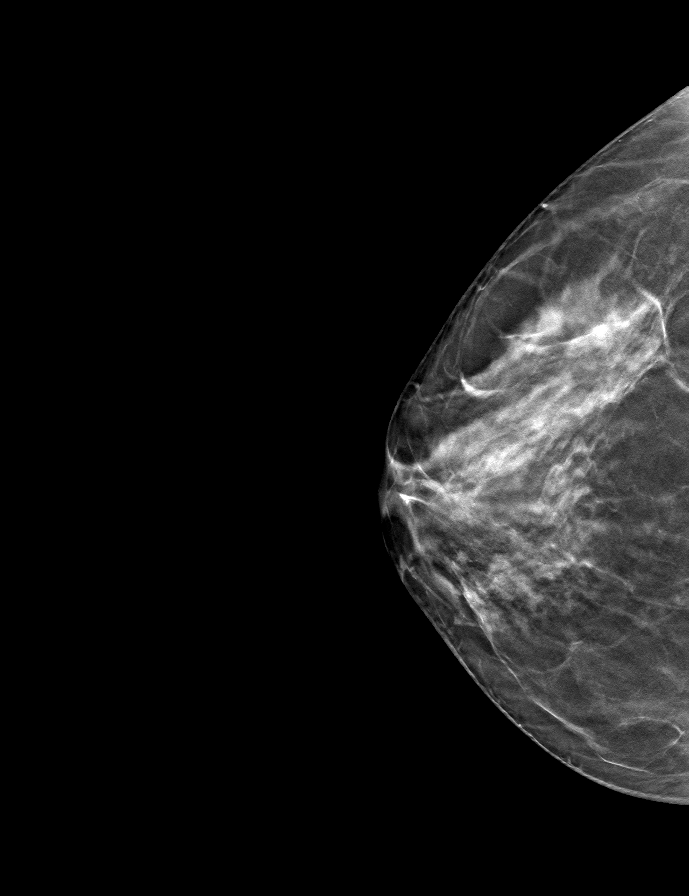

[L MLO tomo · tomo slice 32/63.0]
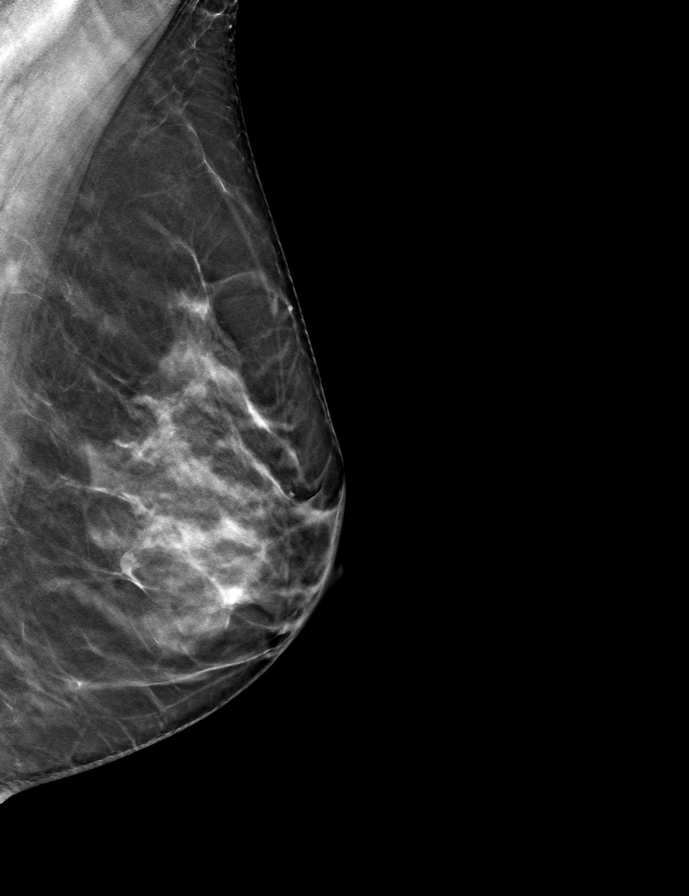

[L CC tomo · tomo slice 32/63.0]
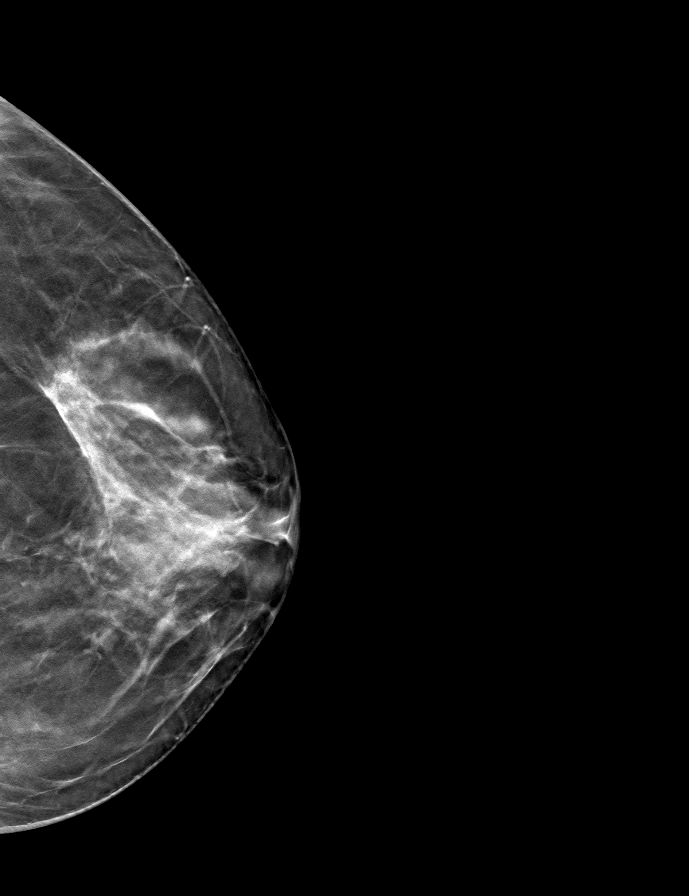

[R MLO tomo · tomo slice 32/63.0]
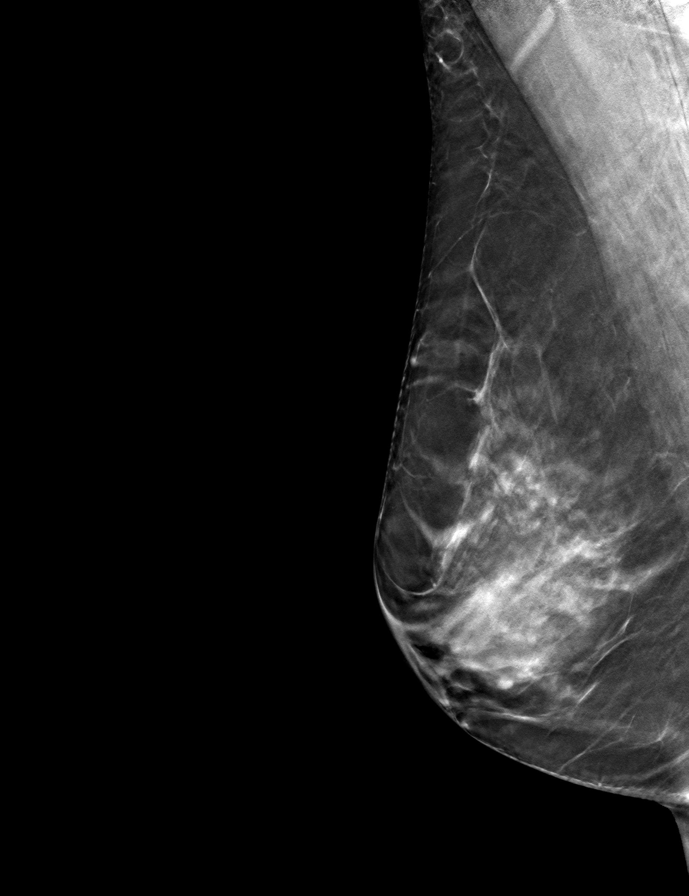

[9 of 24 positions shown; findings below may reference images not displayed]

ACR Breast Density Category c: The breast tissue is heterogeneously
dense, which may obscure small masses.
FINDINGS: There are no findings suspicious for malignancy. Images were
processed with CAD.
IMPRESSION: No mammographic evidence of malignancy. A result letter of this
screening mammogram will be mailed directly to the patient.

RECOMMENDATION:
Screening mammogram in one year. (Code:[id number])

BI-RADS CATEGORY  1: Negative.

## 2020-12-15 ENCOUNTER — Other Ambulatory Visit: Payer: Self-pay | Admitting: Internal Medicine

## 2020-12-15 DIAGNOSIS — N6489 Other specified disorders of breast: Secondary | ICD-10-CM

## 2020-12-16 ENCOUNTER — Other Ambulatory Visit: Payer: Self-pay | Admitting: Internal Medicine

## 2020-12-16 DIAGNOSIS — R928 Other abnormal and inconclusive findings on diagnostic imaging of breast: Secondary | ICD-10-CM

## 2020-12-30 ENCOUNTER — Ambulatory Visit: Payer: 59

## 2020-12-30 ENCOUNTER — Other Ambulatory Visit: Payer: Self-pay

## 2020-12-30 ENCOUNTER — Ambulatory Visit
Admission: RE | Admit: 2020-12-30 | Discharge: 2020-12-30 | Disposition: A | Discharge status: Home / Self Care | Payer: 59 | Source: Ambulatory Visit | Attending: Internal Medicine | Admitting: Internal Medicine

## 2020-12-30 DIAGNOSIS — R928 Other abnormal and inconclusive findings on diagnostic imaging of breast: Secondary | ICD-10-CM

## 2021-10-18 ENCOUNTER — Other Ambulatory Visit: Payer: Self-pay | Admitting: Internal Medicine

## 2021-10-18 DIAGNOSIS — Z1231 Encounter for screening mammogram for malignant neoplasm of breast: Secondary | ICD-10-CM

## 2021-12-13 ENCOUNTER — Ambulatory Visit
Admission: RE | Admit: 2021-12-13 | Discharge: 2021-12-13 | Disposition: A | Discharge status: Home / Self Care | Payer: 59 | Source: Ambulatory Visit | Attending: Internal Medicine | Admitting: Internal Medicine

## 2021-12-13 DIAGNOSIS — Z1231 Encounter for screening mammogram for malignant neoplasm of breast: Secondary | ICD-10-CM

## 2022-10-26 ENCOUNTER — Other Ambulatory Visit: Payer: Self-pay | Admitting: Obstetrics and Gynecology

## 2022-10-26 DIAGNOSIS — Z1231 Encounter for screening mammogram for malignant neoplasm of breast: Secondary | ICD-10-CM

## 2022-12-17 ENCOUNTER — Ambulatory Visit
Admission: RE | Admit: 2022-12-17 | Discharge: 2022-12-17 | Disposition: A | Discharge status: Home / Self Care | Payer: 59 | Source: Ambulatory Visit | Attending: Obstetrics and Gynecology | Admitting: Obstetrics and Gynecology

## 2022-12-17 DIAGNOSIS — Z1231 Encounter for screening mammogram for malignant neoplasm of breast: Secondary | ICD-10-CM | POA: Diagnosis not present

## 2023-07-03 ENCOUNTER — Encounter: Payer: Self-pay | Admitting: Family Medicine

## 2023-07-03 DIAGNOSIS — Z1231 Encounter for screening mammogram for malignant neoplasm of breast: Secondary | ICD-10-CM

## 2023-07-04 ENCOUNTER — Other Ambulatory Visit: Payer: Self-pay | Admitting: Family Medicine

## 2023-07-04 DIAGNOSIS — N644 Mastodynia: Secondary | ICD-10-CM

## 2023-07-04 DIAGNOSIS — N63 Unspecified lump in unspecified breast: Secondary | ICD-10-CM

## 2023-07-16 ENCOUNTER — Ambulatory Visit
Admission: RE | Admit: 2023-07-16 | Discharge: 2023-07-16 | Disposition: A | Discharge status: Home / Self Care | Payer: 59 | Source: Ambulatory Visit | Attending: Family Medicine | Admitting: Family Medicine

## 2023-07-16 DIAGNOSIS — N63 Unspecified lump in unspecified breast: Secondary | ICD-10-CM

## 2023-07-16 DIAGNOSIS — N644 Mastodynia: Secondary | ICD-10-CM | POA: Diagnosis present

## 2023-11-19 ENCOUNTER — Other Ambulatory Visit: Payer: Self-pay | Admitting: Internal Medicine

## 2023-11-19 DIAGNOSIS — Z1231 Encounter for screening mammogram for malignant neoplasm of breast: Secondary | ICD-10-CM

## 2023-12-18 ENCOUNTER — Ambulatory Visit
Admission: RE | Admit: 2023-12-18 | Discharge: 2023-12-18 | Disposition: A | Discharge status: Home / Self Care | Source: Ambulatory Visit | Attending: Internal Medicine | Admitting: Internal Medicine

## 2023-12-18 DIAGNOSIS — Z1231 Encounter for screening mammogram for malignant neoplasm of breast: Secondary | ICD-10-CM | POA: Insufficient documentation
